# Patient Record
Sex: Female | Born: 1990 | Hispanic: Yes | Marital: Single | Smoking: Never smoker
Health system: Southern US, Community
[De-identification: ages and names within clinical notes are randomized; demographics above are authoritative.]

---
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
## 2012-01-04 ENCOUNTER — Emergency Department: Payer: Self-pay | Admitting: *Deleted

## 2012-01-04 LAB — URINALYSIS, COMPLETE
Blood: NEGATIVE
Glucose,UR: NEGATIVE mg/dL (ref 0–75)
Nitrite: NEGATIVE
Protein: NEGATIVE
Specific Gravity: 1.015 (ref 1.003–1.030)
WBC UR: 3 /HPF (ref 0–5)

## 2012-01-04 LAB — COMPREHENSIVE METABOLIC PANEL
Albumin: 3.3 g/dL — ABNORMAL LOW (ref 3.4–5.0)
Alkaline Phosphatase: 79 U/L (ref 50–136)
BUN: 11 mg/dL (ref 7–18)
Bilirubin,Total: 0.3 mg/dL (ref 0.2–1.0)
Creatinine: 0.52 mg/dL — ABNORMAL LOW (ref 0.60–1.30)
EGFR (African American): >60
EGFR (Non-African Amer.): >60
Glucose: 81 mg/dL (ref 65–99)
Osmolality: 278 (ref 275–301)
Potassium: 3.5 mmol/L (ref 3.5–5.1)
SGPT (ALT): 25 U/L
Sodium: 140 mmol/L (ref 136–145)
Total Protein: 7.6 g/dL (ref 6.4–8.2)

## 2012-01-04 LAB — CBC
HGB: 10.8 g/dL — ABNORMAL LOW (ref 12.0–16.0)
MCH: 30.5 pg (ref 26.0–34.0)
MCHC: 34.3 g/dL (ref 32.0–36.0)
Platelet: 309 10*3/uL (ref 150–440)
RDW: 12.8 % (ref 11.5–14.5)

## 2012-01-13 ENCOUNTER — Ambulatory Visit: Payer: Self-pay | Admitting: Family Medicine

## 2012-05-09 ENCOUNTER — Ambulatory Visit: Payer: Self-pay | Admitting: Physician Assistant

## 2012-06-22 ENCOUNTER — Ambulatory Visit: Payer: Self-pay | Admitting: Obstetrics and Gynecology

## 2012-06-22 LAB — HEMOGLOBIN: HGB: 12.6 g/dL (ref 12.0–16.0)

## 2012-06-23 ENCOUNTER — Inpatient Hospital Stay: Payer: Self-pay | Admitting: Obstetrics and Gynecology

## 2012-06-24 LAB — HEMATOCRIT: HCT: 29 % — ABNORMAL LOW (ref 35.0–47.0)

## 2012-06-27 LAB — PATHOLOGY REPORT

## 2012-07-19 ENCOUNTER — Emergency Department: Payer: Self-pay | Admitting: *Deleted

## 2012-07-19 LAB — CBC WITH DIFFERENTIAL/PLATELET
Basophil #: 0 10*3/uL (ref 0.0–0.1)
Basophil %: 0.3 %
HCT: 35.2 % (ref 35.0–47.0)
HGB: 11.9 g/dL — ABNORMAL LOW (ref 12.0–16.0)
Lymphocyte #: 1.4 10*3/uL (ref 1.0–3.6)
Lymphocyte %: 12.2 %
MCH: 29.4 pg (ref 26.0–34.0)
MCHC: 33.9 g/dL (ref 32.0–36.0)
MCV: 87 fL (ref 80–100)
Neutrophil #: 8.9 10*3/uL — ABNORMAL HIGH (ref 1.4–6.5)
Neutrophil %: 79.1 %
RDW: 14.4 % (ref 11.5–14.5)

## 2012-07-19 LAB — BASIC METABOLIC PANEL
Anion Gap: 12 (ref 7–16)
Calcium, Total: 8.7 mg/dL (ref 8.5–10.1)
Co2: 23 mmol/L (ref 21–32)
EGFR (African American): >60
Osmolality: 283 (ref 275–301)
Potassium: 3.5 mmol/L (ref 3.5–5.1)
Sodium: 141 mmol/L (ref 136–145)

## 2014-03-01 ENCOUNTER — Emergency Department: Payer: Self-pay | Admitting: Emergency Medicine

## 2014-03-01 LAB — COMPREHENSIVE METABOLIC PANEL
ANION GAP: 5 — AB (ref 7–16)
Albumin: 4.1 g/dL (ref 3.4–5.0)
Alkaline Phosphatase: 64 U/L
BILIRUBIN TOTAL: 1.5 mg/dL — AB (ref 0.2–1.0)
BUN: 18 mg/dL (ref 7–18)
CALCIUM: 8.8 mg/dL (ref 8.5–10.1)
CHLORIDE: 108 mmol/L — AB (ref 98–107)
CREATININE: 0.77 mg/dL (ref 0.60–1.30)
Co2: 27 mmol/L (ref 21–32)
Glucose: 104 mg/dL — ABNORMAL HIGH (ref 65–99)
Osmolality: 282 (ref 275–301)
Potassium: 3.9 mmol/L (ref 3.5–5.1)
SGOT(AST): 19 U/L (ref 15–37)
SGPT (ALT): 19 U/L (ref 12–78)
Sodium: 140 mmol/L (ref 136–145)
Total Protein: 7.5 g/dL (ref 6.4–8.2)

## 2014-03-01 LAB — URINALYSIS, COMPLETE
BILIRUBIN, UR: NEGATIVE
Bacteria: NONE SEEN
Glucose,UR: NEGATIVE mg/dL (ref 0–75)
Ketone: NEGATIVE
Leukocyte Esterase: NEGATIVE
Nitrite: NEGATIVE
PH: 6 (ref 4.5–8.0)
Protein: 30
RBC,UR: 62 /HPF (ref 0–5)
Specific Gravity: 1.039 (ref 1.003–1.030)
Squamous Epithelial: 2
WBC UR: 2 /HPF (ref 0–5)

## 2014-03-01 LAB — CBC WITH DIFFERENTIAL/PLATELET
BASOS ABS: 0 10*3/uL (ref 0.0–0.1)
BASOS PCT: 0.5 %
Eosinophil #: 0.1 10*3/uL (ref 0.0–0.7)
Eosinophil %: 0.9 %
HCT: 36.9 % (ref 35.0–47.0)
HGB: 12.5 g/dL (ref 12.0–16.0)
LYMPHS ABS: 2.3 10*3/uL (ref 1.0–3.6)
Lymphocyte %: 23.8 %
MCH: 30.1 pg (ref 26.0–34.0)
MCHC: 33.8 g/dL (ref 32.0–36.0)
MCV: 89 fL (ref 80–100)
Monocyte #: 0.5 x10 3/mm (ref 0.2–0.9)
Monocyte %: 5.1 %
NEUTROS ABS: 6.9 10*3/uL — AB (ref 1.4–6.5)
Neutrophil %: 69.7 %
Platelet: 259 10*3/uL (ref 150–440)
RBC: 4.15 10*6/uL (ref 3.80–5.20)
RDW: 13.9 % (ref 11.5–14.5)
WBC: 9.9 10*3/uL (ref 3.6–11.0)

## 2014-03-01 LAB — GC/CHLAMYDIA PROBE AMP

## 2014-03-01 LAB — WET PREP, GENITAL

## 2015-04-01 NOTE — Discharge Summary (Signed)
PATIENT NAME:  Stephanie PhenixLEGRIA Ward, Stephanie Ward MR#:  [id number] DATE OF BIRTH:  Mar 31, 1991  DATE OF ADMISSION:  06/23/2012 DATE OF DISCHARGE:  06/26/2012  PRINCIPLE PROCEDURE: Elective repeat Cesarean section and bilateral tubal ligation.   HOSPITAL COURSE: The patient was discharged to home on postop day #3 without difficulty. Postop day #1 hematocrit 29.1%. Vital signs stable. Discharged to home in good condition. She will follow-up with Dr. Feliberto GottronSchermerhorn in two weeks for wound care or before if she has nausea, vomiting, wound drainage, redness, fever.   ____________________________ Suzy Bouchardhomas J. Schermerhorn, MD tjs:drc D: 07/03/2012 09:14:58 ET T: 07/03/2012 14:42:32 ET JOB#: [id number]  cc: Suzy Bouchardhomas J. Schermerhorn, MD, <Dictator> Suzy BouchardHOMAS J SCHERMERHORN MD ELECTRONICALLY SIGNED 07/06/2012 10:43

## 2015-04-06 NOTE — Op Note (Signed)
PATIENT NAME:  Adam PhenixLEGRIA MORALES, Jaclynn MR#:  [id number] DATE OF BIRTH:  1991-02-21  DATE OF PROCEDURE:  06/23/2012  PREOPERATIVE DIAGNOSES: Elective repeat cesarean section, elective permanent sterilization.   POSTOPERATIVE DIAGNOSES: Elective repeat cesarean section, elective permanent sterilization.   PROCEDURES:  1. Repeat low transverse cesarean section. 2. Bilateral tubal ligation.  3. On-Q pump placement.  SURGEON: Suzy Bouchardhomas J. Marquel Spoto, M.D.   FIRST ASSISTANT: Acquanetta BellingAngela Lugiano, CNM  ANESTHESIA: General endotracheal anesthesia, failed spinal.   INDICATION: This is a 24 year old gravida 3, para 2, a patient with two previous cesarean sections with EDC of 06/28/2012. The patient desires elective permanent sterilization.   DESCRIPTION OF PROCEDURE: Spinal was placed in normal standard fashion. The patient was then prepped and draped in normal standard fashion. She did not have adequate surgical block and therefore Dr. Darleene CleaverVan Staveren converted to a general endotracheal anesthesia with Dr. Abundio MiuHorowitz in the operating room during the procedure. 2 grams of IV Ancef was administered prior to commencement of the case. A Pfannenstiel incision was made after the patient was intubated. Sharp dissection was used to identify the fascia. The fascia was opened in the midline, opened in a transverse fashion. The superior aspect of the fascia was grasped with Kocher clamps and the recti muscles dissected free. The inferior aspect of the fascia was grasped with Kocher clamps and Pyramidalis muscle was dissected free. Entry into the peritoneal cavity was accomplished sharply. The vesicouterine peritoneal fold was identified and opened, a bladder flap was created, and the bladder was reflected inferiorly. A low transverse uterine incision was made. Upon entry into the endometrial cavity, clear fluid resulted. The fetal head was brought to the incision and the infant was delivered without difficulty. A vigorous female  was passed to Dr. Abundio MiuHorowitz who assigned Apgar scores of 9 and 9. The placenta was manually delivered. The patient was administered 40 units of Pitocin in 1 liter bag. The uterus was exteriorized and wiped clean with laparotomy tape. Ring forceps was used to open the cervix. This was passed off the operative field. The uterine incision was closed with one chromic suture in a running locking fashion with good approximation of edges and good hemostasis was noted. Fallopian tube on the right was grasped with the Babcock clamp. Two separate 0 plain gut sutures were applied to the fallopian tube and a 1.5 cm portion of the fallopian tube removed. Good hemostasis was noted. A similar procedure was repeated on the patient's left fallopian tube. Again, two separate 0 plain gut sutures were applied and a 1.5 cm portion of the fallopian tube removed. Good hemostasis was noted. The posterior cul-de-sac was suctioned and irrigated. The uterus was placed back into the endometrial cavity. The paracolic gutters were wiped clean with laparotomy tape. The uterine incision again appeared hemostatic. Interceed was placed over the lower uterine segment in a T-shaped fashion. The superior aspect of the fascia was grasped with Kocher clamps and the On-Q pump catheters were advanced subfascially. The fascia was then closed over top of this with 0 Vicryl suture in a running nonlocking fashion with good approximation of edges. Subcutaneous tissues were irrigated and bovied for hemostasis and the skin was reapproximated with Insorb absorbable staples and the On-Q pump catheters were then secured with Dermabond, Steri-Strips, and Tegaderm and were loaded with 5 mL of 0.5% Marcaine each. There were no complications. Estimated blood loss 300 mL. Intraoperative fluids 1000 mL. The patient tolerated the procedure well and was taken to the recovery room in  good condition. ____________________________ Suzy Bouchard,  MD tjs:slb D: 06/23/2012 08:30:39 ET T: 06/23/2012 09:38:48 ET JOB#: [id number]  cc: Suzy Bouchard, MD, <Dictator> Suzy Bouchard MD ELECTRONICALLY SIGNED 06/23/2012 10:17

## 2020-12-15 ENCOUNTER — Other Ambulatory Visit: Payer: Self-pay

## 2020-12-15 ENCOUNTER — Emergency Department
Admission: EM | Admit: 2020-12-15 | Discharge: 2020-12-15 | Disposition: A | Discharge status: Home / Self Care | Payer: Self-pay | Attending: Emergency Medicine | Admitting: Emergency Medicine

## 2020-12-15 ENCOUNTER — Encounter: Payer: Self-pay | Admitting: Emergency Medicine

## 2020-12-15 DIAGNOSIS — R0789 Other chest pain: Secondary | ICD-10-CM | POA: Insufficient documentation

## 2020-12-15 LAB — CBC
HCT: 40.3 % (ref 36.0–46.0)
Hemoglobin: 14 g/dL (ref 12.0–15.0)
MCH: 30.8 pg (ref 26.0–34.0)
MCHC: 34.7 g/dL (ref 30.0–36.0)
MCV: 88.8 fL (ref 80.0–100.0)
Platelets: 314 10*3/uL (ref 150–400)
RBC: 4.54 MIL/uL (ref 3.87–5.11)
RDW: 11.6 % (ref 11.5–15.5)
WBC: 9.4 10*3/uL (ref 4.0–10.5)
nRBC: 0 % (ref 0.0–0.2)

## 2020-12-15 LAB — BASIC METABOLIC PANEL
Anion gap: 9 (ref 5–15)
BUN: 18 mg/dL (ref 6–20)
CO2: 28 mmol/L (ref 22–32)
Calcium: 9 mg/dL (ref 8.9–10.3)
Chloride: 103 mmol/L (ref 98–111)
Creatinine, Ser: 0.59 mg/dL (ref 0.44–1.00)
GFR, Estimated: >60 mL/min (ref >60)
Glucose, Bld: 141 mg/dL — ABNORMAL HIGH (ref 70–99)
Potassium: 3.7 mmol/L (ref 3.5–5.1)
Sodium: 140 mmol/L (ref 135–145)

## 2020-12-15 LAB — TROPONIN I (HIGH SENSITIVITY): Troponin I (High Sensitivity): 4 ng/L (ref <18)

## 2020-12-15 LAB — PREGNANCY, URINE: Preg Test, Ur: NEGATIVE

## 2020-12-15 NOTE — ED Provider Notes (Signed)
[facility] Emergency Department Provider Note   ____________________________________________    I have reviewed the triage vital signs and the nursing notes.   HISTORY  Chief Complaint Chest Pain     HPI Stephanie Ward is a 30 y.o. female who presents with complaints of chest discomfort.  Patient reports last night when she was lying down she developed a discomfort in her central chest and felt like it radiated to her left shoulder.  She was reports it resolved on her own.  She is never had this before.  Happened again today after eating lunch.  Currently feels well and has no complaints.  Last time she had the pain was 3 hours prior to arrival.  No fevers chills cough.  No shortness of breath.  No pleurisy.  No calf pain or swelling.  History reviewed. No pertinent past medical history.  There are no problems to display for this patient.   Past Surgical History:  Procedure Laterality Date  . CESAREAN SECTION      Prior to Admission medications   Not on File     Allergies Patient has no known allergies.  History reviewed. No pertinent family history.  Social History Social History   Tobacco Use  . Smoking status: Never Smoker  . Smokeless tobacco: Never Used  Substance Use Topics  . Alcohol use: Not Currently    Review of Systems  Constitutional: No fever/chills Eyes: No visual changes.  ENT: No sore throat. Cardiovascular: As above Respiratory as above Gastrointestinal: No abdominal pain.  No nausea, no vomiting.   Genitourinary: Negative for dysuria. Musculoskeletal: Negative for back pain. Skin: Negative for rash. Neurological: Negative for headaches or weakness   ____________________________________________   PHYSICAL EXAM:  VITAL SIGNS: ED Triage Vitals  Enc Vitals Group     BP 12/15/20 1741 111/72     Pulse Rate 12/15/20 1741 77     Resp 12/15/20 1741 17     Temp 12/15/20 1741 98.8 F (37.1 C)      Temp Source 12/15/20 1741 Oral     SpO2 12/15/20 1741 100 %     Weight 12/15/20 1742 65.8 kg (145 lb)     Height 12/15/20 1742 1.626 m (5\' 4" )     Head Circumference --      Peak Flow --      Pain Score 12/15/20 1741 5     Pain Loc --      Pain Edu? --      Excl. in GC? --     Constitutional: Alert and oriented.   Nose: No congestion/rhinnorhea. Mouth/Throat: Mucous membranes are moist.    Cardiovascular: Normal rate, regular rhythm. Good peripheral circulation. Respiratory: Normal respiratory effort.  No retractions. Gastrointestinal: Soft and nontender. No distention.  No CVA tenderness.  Musculoskeletal: No lower extremity tenderness nor edema.  Warm and well perfused Neurologic:  Normal speech and language. No gross focal neurologic deficits are appreciated.  Skin:  Skin is warm, dry and intact. No rash noted. Psychiatric: Mood and affect are normal. Speech and behavior are normal.  ____________________________________________   LABS (all labs ordered are listed, but only abnormal results are displayed)  Labs Reviewed  BASIC METABOLIC PANEL - Abnormal; Notable for the following components:      Result Value   Glucose, Bld 141 (*)    All other components within normal limits  CBC  PREGNANCY, URINE  TROPONIN I (HIGH SENSITIVITY)   ____________________________________________  EKG  ED  ECG REPORT I, Jene Every, the attending physician, personally viewed and interpreted this ECG.  Date: 12/15/2020  Rhythm: normal sinus rhythm QRS Axis: normal Intervals: normal ST/T Wave abnormalities: normal Narrative Interpretation: no evidence of acute ischemia  ____________________________________________  RADIOLOGY   ____________________________________________   PROCEDURES  Procedure(s) performed: No  Procedures   Critical Care performed: No ____________________________________________   INITIAL IMPRESSION / ASSESSMENT AND PLAN / ED COURSE  Pertinent  labs & imaging results that were available during my care of the patient were reviewed by me and considered in my medical decision making (see chart for details).  Patient well-appearing and in no acute distress.  Asymptomatic here in the emergency department.  Differential includes GERD/esophagitis, not consistent with ACS nor PE nor myocarditis.  Troponin is normal.  Lab work is unremarkable.  EKG is normal.  Patient is asymptomatic, appropriate for discharge at this time with outpatient follow-up as needed, return precautions discussed    ____________________________________________   FINAL CLINICAL IMPRESSION(S) / ED DIAGNOSES  Final diagnoses:  Atypical chest pain        Note:  This document was prepared using Dragon voice recognition software and may include unintentional dictation errors.   Jene Every, MD 12/15/20 [phone]

## 2020-12-15 NOTE — ED Triage Notes (Signed)
Pt comes into the ED via POV c/o left CP that started last night and radiating into the left arm and neck.  Pt states her left rm had some numbness and tingling.  Pt denies any SHOB but did have some dizziness with the CP.  Pt states she was laying down when the pain started.  Pt denies any heart problems.  Pt currently has even and unlabored respirations and is in NAD>

## 2021-01-14 ENCOUNTER — Ambulatory Visit: Payer: Self-pay | Admitting: Internal Medicine

## 2021-01-14 NOTE — Progress Notes (Deleted)
   New Outpatient Visit Date: 01/14/2021  Referring Provider: Jene Every, MD [facility] Emergency Department  Chief Complaint: ***  HPI:  Ms. Stephanie Ward is a 30 y.o. female who is being seen today for the evaluation of chest pain at the request of Dr. Cyril Loosen. She has no significant past medical history.  She presented to the [facility] emergency department on 12/15/2020 complaining of self-limited chest pain.  ED work-up was unrevealing.  --------------------------------------------------------------------------------------------------  Cardiovascular History & Procedures: Cardiovascular Problems:  ***  Risk Factors:  ***  Cath/PCI:  ***  CV Surgery:  ***  EP Procedures and Devices:  ***  Non-Invasive Evaluation(s):  ***  Recent CV Pertinent Labs: Lab Results  Component Value Date   K 3.7 12/15/2020   K 3.9 03/01/2014   BUN 18 12/15/2020   BUN 18 03/01/2014   CREATININE 0.59 12/15/2020   CREATININE 0.77 03/01/2014    --------------------------------------------------------------------------------------------------  No past medical history on file.  Past Surgical History:  Procedure Laterality Date  . CESAREAN SECTION      No outpatient medications have been marked as taking for the 01/14/21 encounter (Appointment) with Njeri Vicente, Cristal Deer, MD.    Allergies: Patient has no known allergies.  Social History   Tobacco Use  . Smoking status: Never Smoker  . Smokeless tobacco: Never Used  Substance Use Topics  . Alcohol use: Not Currently    No family history on file.  Review of Systems: A 12-system review of systems was performed and was negative except as noted in the HPI.  --------------------------------------------------------------------------------------------------  Physical Exam: There were no vitals taken for this visit.  General:  *** HEENT: No conjunctival pallor or scleral icterus. Facemask in place. Neck: Supple without lymphadenopathy,  thyromegaly, JVD, or HJR. No carotid bruit. Lungs: Normal work of breathing. Clear to auscultation bilaterally without wheezes or crackles. Heart: Regular rate and rhythm without murmurs, rubs, or gallops. Non-displaced PMI. Abd: Bowel sounds present. Soft, NT/ND without hepatosplenomegaly Ext: No lower extremity edema. Radial, PT, and DP pulses are 2+ bilaterally Skin: Warm and dry without rash. Neuro: CNIII-XII intact. Strength and fine-touch sensation intact in upper and lower extremities bilaterally. Psych: Normal mood and affect.  EKG:  ***  Lab Results  Component Value Date   WBC 9.4 12/15/2020   HGB 14.0 12/15/2020   HCT 40.3 12/15/2020   MCV 88.8 12/15/2020   PLT 314 12/15/2020    Lab Results  Component Value Date   NA 140 12/15/2020   K 3.7 12/15/2020   CL 103 12/15/2020   CO2 28 12/15/2020   BUN 18 12/15/2020   CREATININE 0.59 12/15/2020   GLUCOSE 141 (H) 12/15/2020   ALT 19 03/01/2014    No results found for: CHOL, HDL, LDLCALC, LDLDIRECT, TRIG, CHOLHDL   --------------------------------------------------------------------------------------------------  ASSESSMENT AND PLAN: ***  Yvonne Kendall, MD 01/14/2021 12:19 PM

## 2021-01-15 ENCOUNTER — Encounter: Payer: Self-pay | Admitting: Internal Medicine

## 2021-01-26 ENCOUNTER — Ambulatory Visit: Payer: Self-pay

## 2021-05-22 ENCOUNTER — Other Ambulatory Visit: Payer: Self-pay

## 2021-05-22 ENCOUNTER — Emergency Department: Payer: Self-pay

## 2021-05-22 DIAGNOSIS — M25512 Pain in left shoulder: Secondary | ICD-10-CM | POA: Insufficient documentation

## 2021-05-22 DIAGNOSIS — M549 Dorsalgia, unspecified: Secondary | ICD-10-CM | POA: Insufficient documentation

## 2021-05-22 DIAGNOSIS — R0789 Other chest pain: Secondary | ICD-10-CM | POA: Insufficient documentation

## 2021-05-22 LAB — BASIC METABOLIC PANEL
Anion gap: 7 (ref 5–15)
BUN: 24 mg/dL — ABNORMAL HIGH (ref 6–20)
CO2: 26 mmol/L (ref 22–32)
Calcium: 9.5 mg/dL (ref 8.9–10.3)
Chloride: 103 mmol/L (ref 98–111)
Creatinine, Ser: 0.7 mg/dL (ref 0.44–1.00)
GFR, Estimated: >60 mL/min (ref >60)
Glucose, Bld: 112 mg/dL — ABNORMAL HIGH (ref 70–99)
Potassium: 3.7 mmol/L (ref 3.5–5.1)
Sodium: 136 mmol/L (ref 135–145)

## 2021-05-22 LAB — CBC
HCT: 37.1 % (ref 36.0–46.0)
Hemoglobin: 12.8 g/dL (ref 12.0–15.0)
MCH: 30.4 pg (ref 26.0–34.0)
MCHC: 34.5 g/dL (ref 30.0–36.0)
MCV: 88.1 fL (ref 80.0–100.0)
Platelets: 329 10*3/uL (ref 150–400)
RBC: 4.21 MIL/uL (ref 3.87–5.11)
RDW: 12 % (ref 11.5–15.5)
WBC: 9.5 10*3/uL (ref 4.0–10.5)
nRBC: 0 % (ref 0.0–0.2)

## 2021-05-22 LAB — TROPONIN I (HIGH SENSITIVITY): Troponin I (High Sensitivity): <2 ng/L (ref <18)

## 2021-05-22 NOTE — ED Triage Notes (Signed)
Pt presents to ER c/o chest pain that started this morning.  Pt states pain starts in her substernal chest area and radiates to her left shoulder blade.  Pt states pain is very uncomfortable, and she took some tylenol for the pain without relief.  Pt denies hx of heart problems and does not appear in distress at this time.

## 2021-05-23 ENCOUNTER — Emergency Department
Admission: EM | Admit: 2021-05-23 | Discharge: 2021-05-23 | Disposition: A | Discharge status: Home / Self Care | Payer: Self-pay | Attending: Emergency Medicine | Admitting: Emergency Medicine

## 2021-05-23 DIAGNOSIS — R0789 Other chest pain: Secondary | ICD-10-CM

## 2021-05-23 LAB — POC URINE PREG, ED: Preg Test, Ur: NEGATIVE

## 2021-05-23 LAB — TROPONIN I (HIGH SENSITIVITY): Troponin I (High Sensitivity): <2 ng/L (ref <18)

## 2021-05-23 MED ORDER — KETOROLAC TROMETHAMINE 30 MG/ML IJ SOLN
30.0000 mg | Freq: Once | INTRAMUSCULAR | Status: AC
  Administered 2021-05-23: 05:00:00 30 mg via INTRAMUSCULAR
  Filled 2021-05-23: qty 1

## 2021-05-23 NOTE — Discharge Instructions (Addendum)
Use Tylenol for pain and fevers.  Up to 1000 mg per dose, up to 4 times per day.  Do not take more than 4000 mg of Tylenol/acetaminophen within 24 hours..  Use naproxen/Aleve for anti-inflammatory pain relief. Use up to 500mg every 12 hours. Do not take more frequently than this. Do not use other NSAIDs (ibuprofen, Advil) while taking this medication. It is safe to take Tylenol with this.   

## 2021-05-23 NOTE — ED Provider Notes (Signed)
[facility] Emergency Department Provider Note ____________________________________________   Event Date/Time   First MD Initiated Contact with Patient 05/23/21 0251     (approximate)  I have reviewed the triage vital signs and the nursing notes.  HISTORY  Chief Complaint Chest Pain   HPI Stephanie Ward is a 30 y.o. femalewho presents to the ED for evaluation of chest pain.  Chart review indicates no relevant history.  Patient presents to the ED for evaluation of about 24 hours of left back, left shoulder and chest pain.  Denies any falls or trauma.  Patient reports that she woke up this way the morning of 6/10 and has been present throughout the day today.  She reports taking a Tylenol tablet without resolution of her symptoms.  She denies any shortness of breath, cough, fever, syncopal episodes, abdominal pain, emesis.   She presents to the ED for evaluation of her chest pain and to "get checked out."  History reviewed. No pertinent past medical history.  There are no problems to display for this patient.   Past Surgical History:  Procedure Laterality Date   CESAREAN SECTION      Prior to Admission medications   Not on File    Allergies Patient has no known allergies.  History reviewed. No pertinent family history.  Social History Social History   Tobacco Use   Smoking status: Never   Smokeless tobacco: Never  Substance Use Topics   Alcohol use: Not Currently    Review of Systems  Constitutional: No fever/chills Eyes: No visual changes. ENT: No sore throat. Cardiovascular: Positive for chest pain. Respiratory: Denies shortness of breath. Gastrointestinal: No abdominal pain.  No nausea, no vomiting.  No diarrhea.  No constipation. Genitourinary: Negative for dysuria. Musculoskeletal: Positive for atraumatic left-sided shoulder and thoracic back pain. Skin: Negative for rash. Neurological: Negative for headaches,  focal weakness or numbness.  ____________________________________________   PHYSICAL EXAM:  VITAL SIGNS: Vitals:   05/23/21 0050 05/23/21 0228  BP: (!) 109/48 (!) 109/54  Pulse: (!) 54 (!) 59  Resp: 18 17  Temp: 98.2 F (36.8 C) 97.8 F (36.6 C)  SpO2: 96% 99%    Constitutional: Alert and oriented. Well appearing and in no acute distress. Eyes: Conjunctivae are normal. PERRL. EOMI. Head: Atraumatic. Nose: No congestion/rhinnorhea. Mouth/Throat: Mucous membranes are moist.  Oropharynx non-erythematous. Neck: No stridor. No cervical spine tenderness to palpation. Cardiovascular: Normal rate, regular rhythm. Grossly normal heart sounds.  Good peripheral circulation. Respiratory: Normal respiratory effort.  No retractions. Lungs CTAB. Gastrointestinal: Soft , nondistended, nontender to palpation. No CVA tenderness. Musculoskeletal: No lower extremity tenderness nor edema.  No joint effusions. No signs of acute trauma. Tenderness to palpation of her left-sided paraspinal thoracic back, left trapezius muscles and left-sided chest.  This reproduces her presenting symptoms.  No overlying skin changes or signs of trauma. Neurologic:  Normal speech and language. No gross focal neurologic deficits are appreciated. No gait instability noted. Skin:  Skin is warm, dry and intact. No rash noted. Psychiatric: Mood and affect are normal. Speech and behavior are normal. ____________________________________________   LABS (all labs ordered are listed, but only abnormal results are displayed)  Labs Reviewed  BASIC METABOLIC PANEL - Abnormal; Notable for the following components:      Result Value   Glucose, Bld 112 (*)    BUN 24 (*)    All other components within normal limits  CBC  POC URINE PREG, ED  TROPONIN I (HIGH SENSITIVITY)  TROPONIN I (HIGH SENSITIVITY)   ____________________________________________  12 Lead EKG  Sinus rhythm, rate of 71 bpm.  Normal axis and intervals.  No  evidence of acute ischemia. ____________________________________________  RADIOLOGY  ED MD interpretation: 2 view CXR reviewed by me without evidence of acute cardiopulmonary pathology.  Official radiology report(s): DG Chest 2 View  Result Date: 05/22/2021 CLINICAL DATA:  Chest pain. EXAM: CHEST - 2 VIEW COMPARISON:  None. FINDINGS: The heart size and mediastinal contours are within normal limits. Both lungs are clear. The visualized skeletal structures are unremarkable. IMPRESSION: No active cardiopulmonary disease. Electronically Signed   By: Gerome Sam III M.D   On: 05/22/2021 20:40    ____________________________________________   PROCEDURES and INTERVENTIONS  Procedure(s) performed (including Critical Care):  Procedures  Medications  ketorolac (TORADOL) 30 MG/ML injection 30 mg (has no administration in time range)    ____________________________________________   MDM / ED COURSE   Healthy 30 year old woman presents to the ED with atypical chest pains, more likely muscular spasm as an etiology, without evidence of ACS and amenable to outpatient management.  Normal vitals on room air.  Exam with reproducible chest pain, back pain and shoulder pain, with palpable muscular cord, was consistent with muscular spasm.  Her work-up is benign without evidence of ACS, PTX.  Doubt PE.  We will treat with anti-inflammatories and plan for outpatient management with return precautions.  Awaiting pregnancy test prior to discharge.      ____________________________________________   FINAL CLINICAL IMPRESSION(S) / ED DIAGNOSES  Final diagnoses:  Other chest pain     ED Discharge Orders     None        Etienne Millward   Note:  This document was prepared using Dragon voice recognition software and may include unintentional dictation errors.    Delton Prairie, MD 05/23/21 0430

## 2022-02-12 IMAGING — CR DG CHEST 2V
2 series · 2 of 2 positions shown · non-contrast
Comparison: None.

CLINICAL DATA: Chest pain.

EXAM:
CHEST - 2 VIEW

[chest lat]
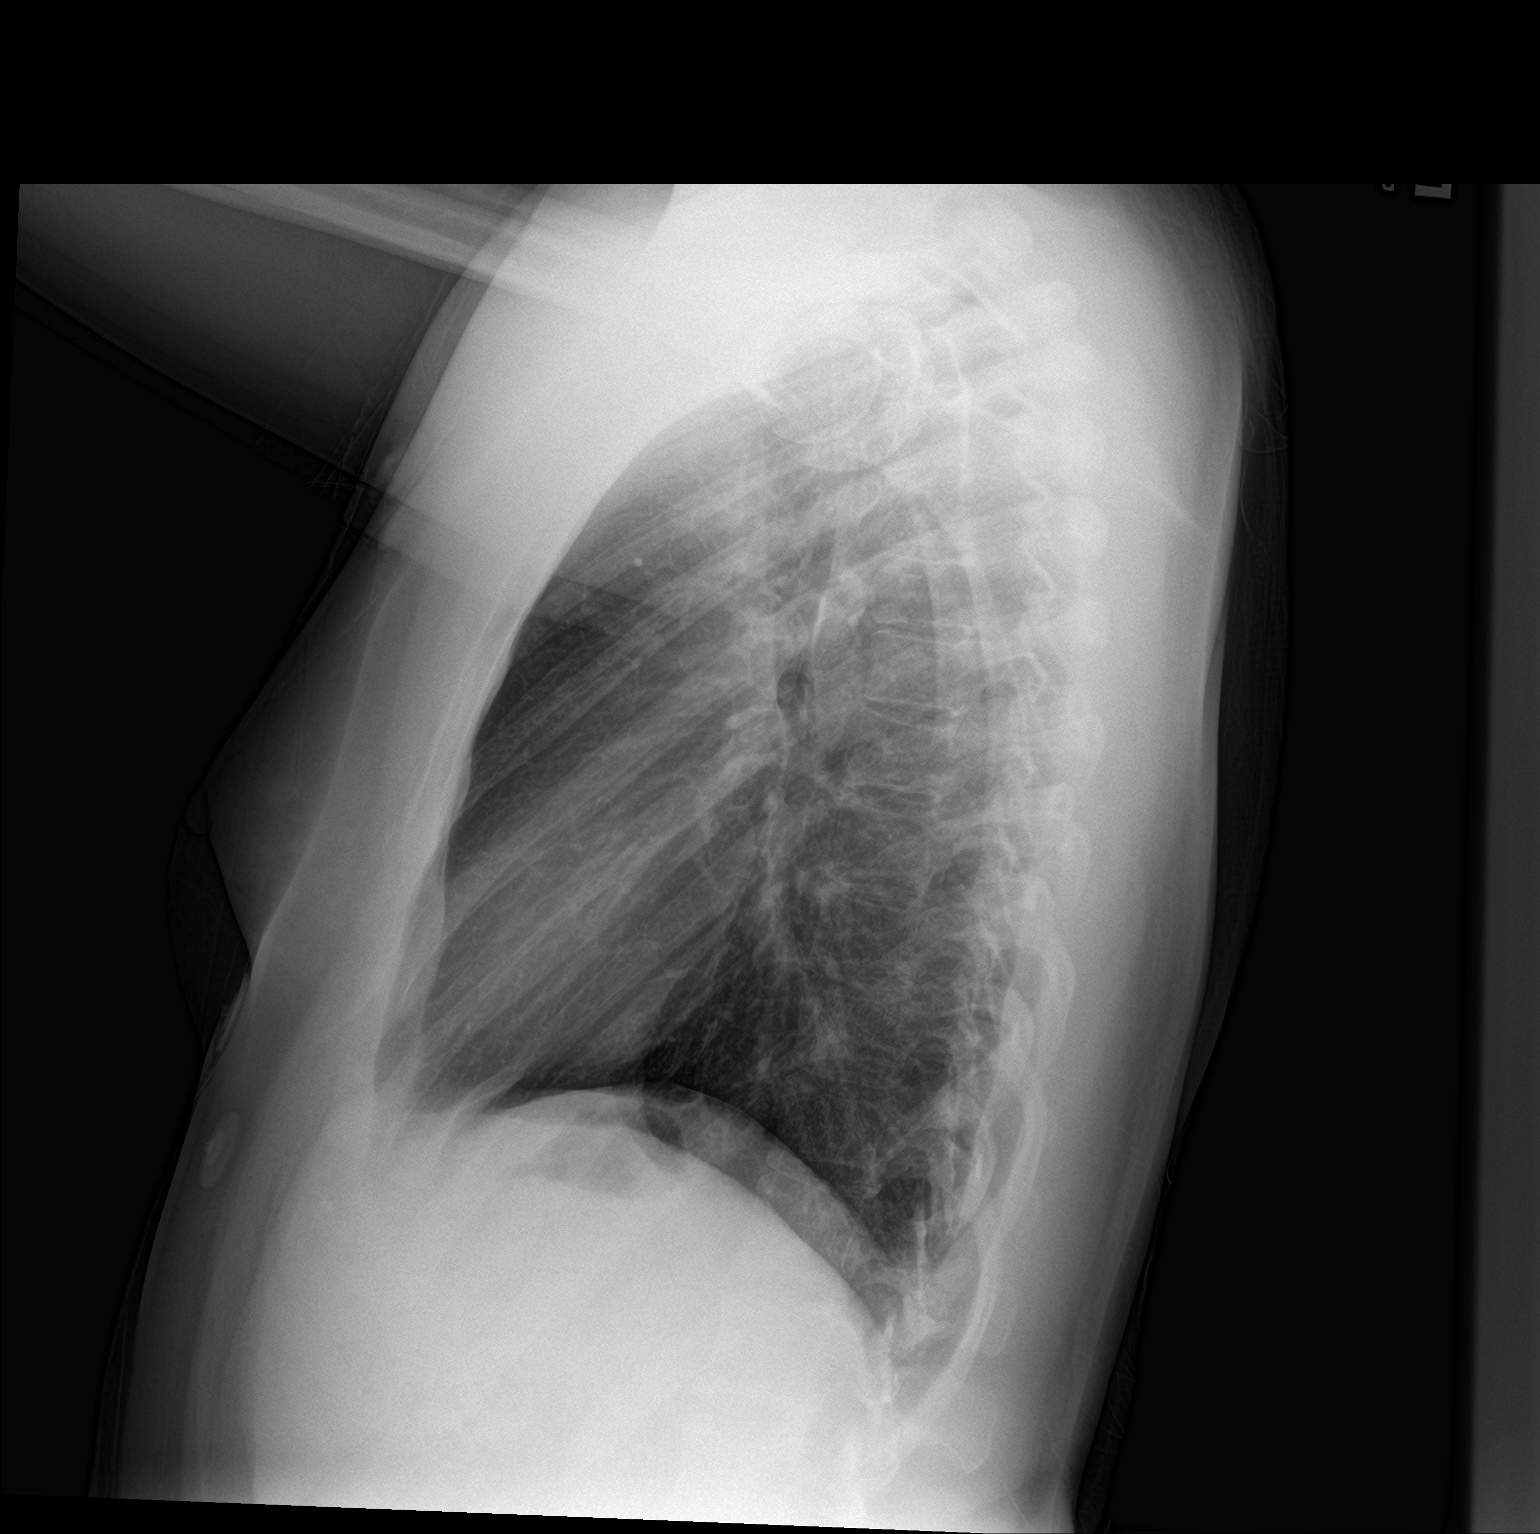

[chest pa]
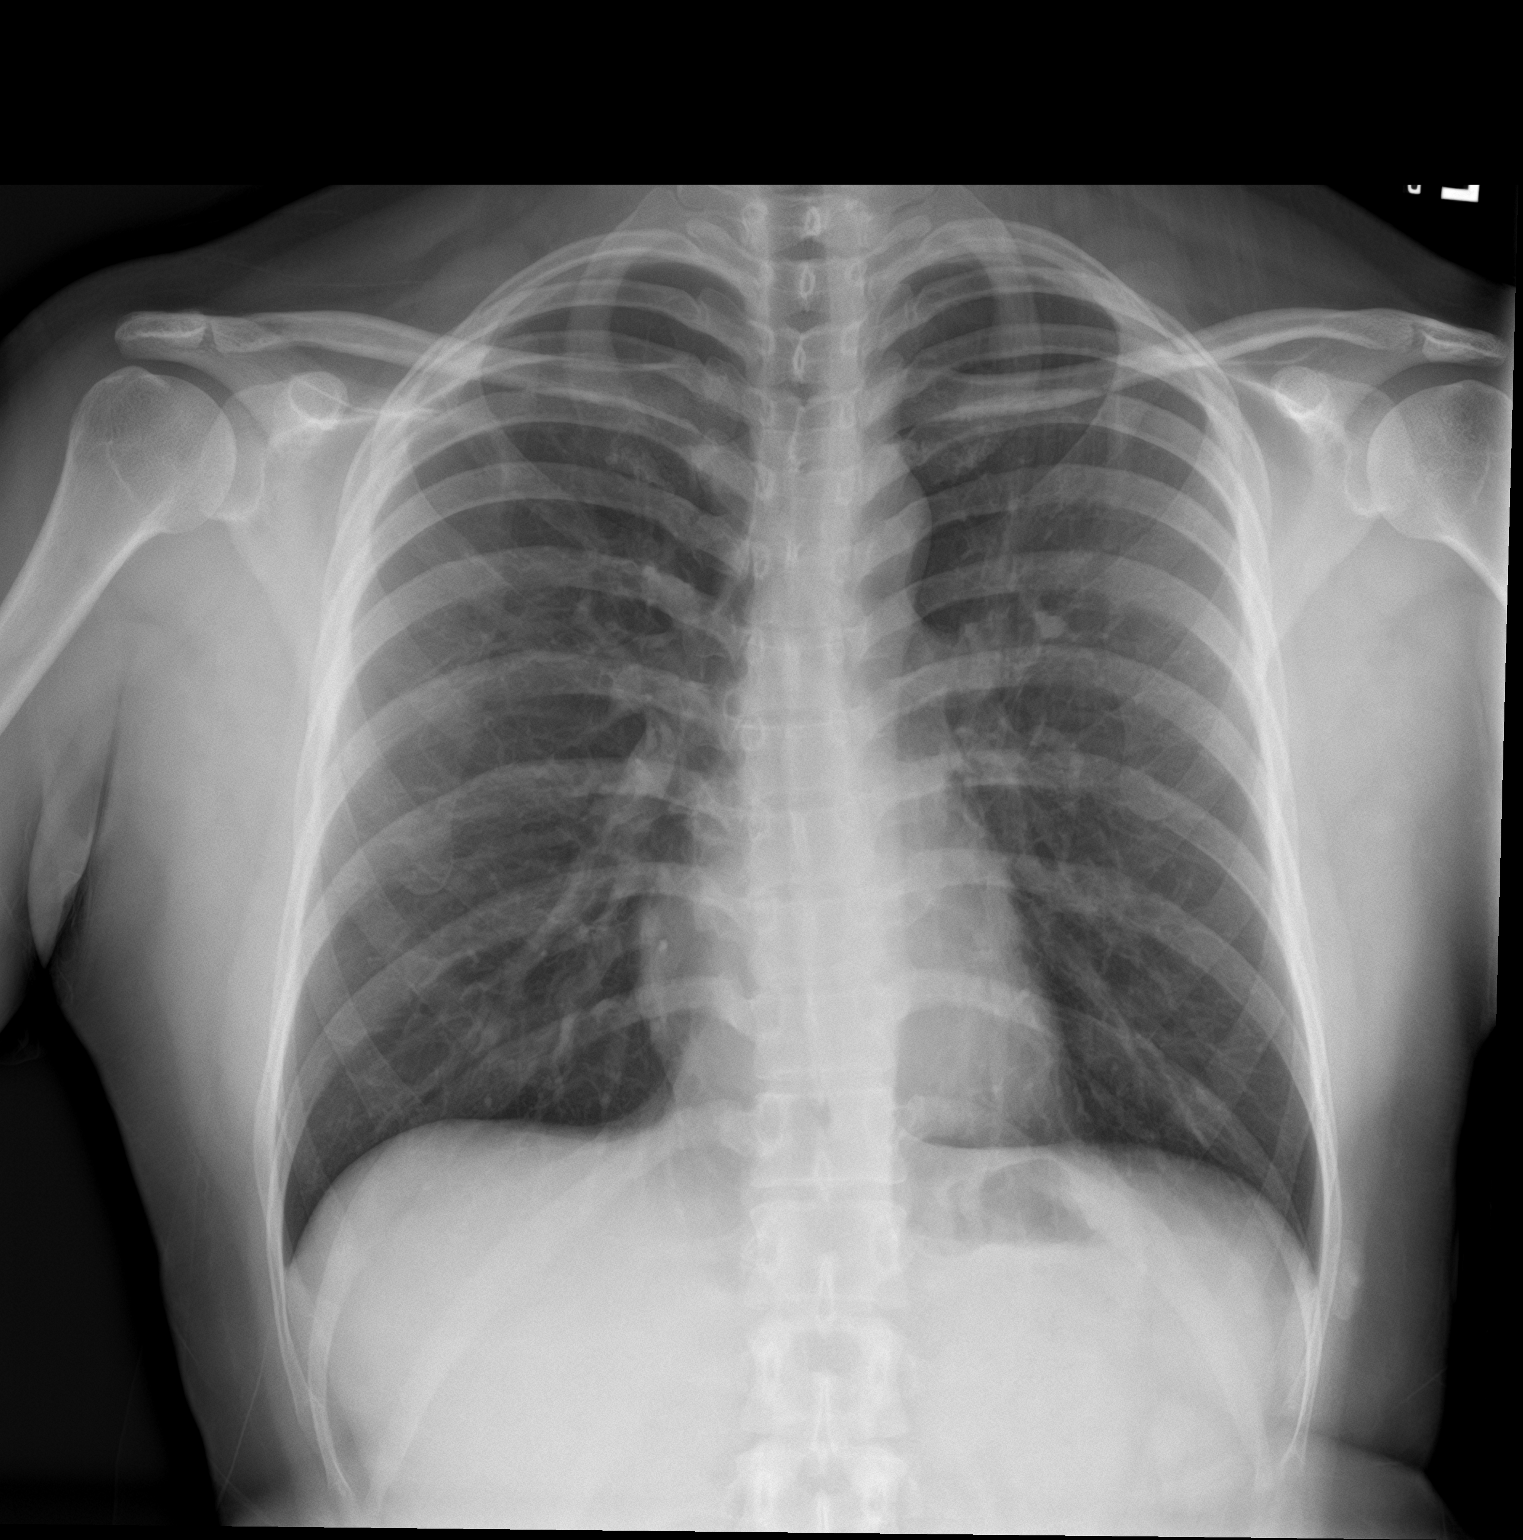

[2 of 2 positions shown; findings below may reference images not displayed]

FINDINGS: The heart size and mediastinal contours are within normal limits.
Both lungs are clear. The visualized skeletal structures are
unremarkable.
IMPRESSION: No active cardiopulmonary disease.

## 2023-12-05 ENCOUNTER — Emergency Department: Payer: Self-pay

## 2023-12-05 ENCOUNTER — Emergency Department
Admission: EM | Admit: 2023-12-05 | Discharge: 2023-12-05 | Disposition: A | Discharge status: Home / Self Care | Payer: Self-pay | Attending: Emergency Medicine | Admitting: Emergency Medicine

## 2023-12-05 ENCOUNTER — Other Ambulatory Visit: Payer: Self-pay

## 2023-12-05 DIAGNOSIS — M25512 Pain in left shoulder: Secondary | ICD-10-CM | POA: Insufficient documentation

## 2023-12-05 DIAGNOSIS — Z20822 Contact with and (suspected) exposure to covid-19: Secondary | ICD-10-CM | POA: Insufficient documentation

## 2023-12-05 LAB — RESP PANEL BY RT-PCR (RSV, FLU A&B, COVID)  RVPGX2
Influenza A by PCR: NEGATIVE
Influenza B by PCR: NEGATIVE
Resp Syncytial Virus by PCR: NEGATIVE
SARS Coronavirus 2 by RT PCR: NEGATIVE

## 2023-12-05 MED ORDER — MELOXICAM 15 MG PO TABS: 15.0000 mg | ORAL_TABLET | Freq: Every day | ORAL | 0 refills | Status: AC

## 2023-12-05 NOTE — Discharge Instructions (Addendum)
Please take the meloxicam once a day for 2 weeks.  While taking the meloxicam do not take any other NSAIDs like ibuprofen, Advil, Aleve or naproxen.  You can take Tylenol as needed for additional pain control.  I also encourage you to use topical pain relievers like IcyHot, Biofreeze and Tiger balm.  You can use ice and heat as needed.  I have attached some information about shoulder pain as well as some stretches and exercises to try.  Please schedule a follow up appointment with Dr. Dallas Schimke, his information is attached. He is an orthopedic specialist. If you have to wait a long time to get in to see him you can try the orthopedic urgent care. Information is below.   [facility] [address] [place], [place] [address] Open every day 9am-9pm

## 2023-12-05 NOTE — ED Provider Notes (Signed)
[facility] Provider Note    Event Date/Time   First MD Initiated Contact with Patient 12/05/23 1806     (approximate)   History   Shoulder Pain   HPI  Stephanie Ward is a 32 y.o. female with no PMH presents for evaluation of left shoulder pain that is an ongoing for about a month.  Patient states that it has been getting worse which is what brings her in today.  She describes it as a deep pain and points to it being around the shoulder blade.  No numbness, tingling or weakness in the left arm.  She works in [employer].  She denies any specific injury.  She has been taking Tylenol but is still having pain.      Physical Exam   Triage Vital Signs: ED Triage Vitals  Encounter Vitals Group     BP 12/05/23 1725 136/74     Systolic BP Percentile --      Diastolic BP Percentile --      Pulse Rate 12/05/23 1725 76     Resp 12/05/23 1725 20     Temp 12/05/23 1725 98.9 F (37.2 C)     Temp Source 12/05/23 1725 Oral     SpO2 12/05/23 1725 98 %     Weight --      Height --      Head Circumference --      Peak Flow --      Pain Score 12/05/23 1723 9     Pain Loc --      Pain Education --      Exclude from Growth Chart --     Most recent vital signs: Vitals:   12/05/23 1725  BP: 136/74  Pulse: 76  Resp: 20  Temp: 98.9 F (37.2 C)  SpO2: 98%    General: Awake, no distress.  CV:  Good peripheral perfusion.  Resp:  Normal effort.  Abd:  No distention.  Left arm: Tender to palpation over the medial scapula, range of motion limited due to pain.  Patient reports pain when moving past 90 degrees of shoulder abduction/flexion and in extension.  Empty can test is negative.  Unable to perform Neer's due to pain.  5/5 strength. Radial pulse 2+ and regular.  No pain at the wrist or elbow.   ED Results / Procedures / Treatments   Labs (all labs ordered are listed, but only abnormal results are displayed) Labs Reviewed  RESP PANEL BY RT-PCR  (RSV, FLU A&B, COVID)  [id number]   RADIOLOGY  Left shoulder x-ray obtained, interpreted the images as well as reviewed the radiologist report which is negative.   PROCEDURES:  Critical Care performed: No  Procedures   MEDICATIONS ORDERED IN ED: Medications - No data to display   IMPRESSION / MDM / ASSESSMENT AND PLAN / ED COURSE  I reviewed the triage vital signs and the nursing notes.                             32 year old female presents for evaluation of left shoulder pain.  Vital signs are stable patient NAD on exam.  Differential diagnosis includes, but is not limited to, muscle strain, cervical radiculopathy, rotator cuff tear, labral tear, fracture, dislocation.  Patient's presentation is most consistent with acute complicated illness / injury requiring diagnostic workup.  Respiratory panel was negative.  Left shoulder x-ray was negative.  I believe patient's pain is  related to a muscle strain as she is tender over the medial scapula and this is where she has pain when performing ROM.  She can continue to take Tylenol as needed but I will also start her on meloxicam.  I recommended she follow-up with orthopedics and she was given information to do so.  We also discussed topical pain relievers like ice, heat and lidocaine patches.  She voiced understanding, all questions were answered and she was stable at discharge.    FINAL CLINICAL IMPRESSION(S) / ED DIAGNOSES   Final diagnoses:  Acute pain of left shoulder     Rx / DC Orders   ED Discharge Orders          Ordered    meloxicam (MOBIC) 15 MG tablet  Daily        12/05/23 1854             Note:  This document was prepared using Dragon voice recognition software and may include unintentional dictation errors.   Cameron Ali, PA-C 12/05/23 1906    Pilar Jarvis, MD 12/05/23 Jerene Bears

## 2023-12-05 NOTE — ED Triage Notes (Signed)
Pt to ED via POV from home. Pt reports left shoulder pain x1  month that is getting worse. Pt reports feels like it is the bone. Pt denies injury. Pt also reports HA that started this morning. Pt denies SOB, CP, cough/congestion.
